# Patient Record
Sex: Female | Born: 1995
Health system: Southern US, Community
[De-identification: ages and names within clinical notes are randomized; demographics above are authoritative.]

## PROBLEM LIST (undated history)

## (undated) DIAGNOSIS — G918 Other hydrocephalus: Secondary | ICD-10-CM

## (undated) DIAGNOSIS — Z8661 Personal history of infections of the central nervous system: Secondary | ICD-10-CM

## (undated) HISTORY — DX: Personal history of infections of the central nervous system: Z86.61

## (undated) HISTORY — DX: Other hydrocephalus: G91.8

---
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
## 2016-04-12 DIAGNOSIS — H5213 Myopia, bilateral: Secondary | ICD-10-CM | POA: Diagnosis not present

## 2016-04-14 DIAGNOSIS — Z0000 Encounter for general adult medical examination without abnormal findings: Secondary | ICD-10-CM | POA: Diagnosis not present

## 2016-04-14 DIAGNOSIS — Z6831 Body mass index (BMI) 31.0-31.9, adult: Secondary | ICD-10-CM | POA: Diagnosis not present

## 2016-04-28 DIAGNOSIS — H52222 Regular astigmatism, left eye: Secondary | ICD-10-CM | POA: Diagnosis not present

## 2016-04-28 DIAGNOSIS — H4720 Unspecified optic atrophy: Secondary | ICD-10-CM | POA: Diagnosis not present

## 2016-04-28 DIAGNOSIS — H1859 Other hereditary corneal dystrophies: Secondary | ICD-10-CM | POA: Diagnosis not present

## 2016-04-28 DIAGNOSIS — H01021 Squamous blepharitis right upper eyelid: Secondary | ICD-10-CM | POA: Diagnosis not present

## 2017-04-29 DIAGNOSIS — Z0000 Encounter for general adult medical examination without abnormal findings: Secondary | ICD-10-CM | POA: Diagnosis not present

## 2017-04-29 DIAGNOSIS — Z6827 Body mass index (BMI) 27.0-27.9, adult: Secondary | ICD-10-CM | POA: Diagnosis not present

## 2017-12-14 ENCOUNTER — Ambulatory Visit (INDEPENDENT_AMBULATORY_CARE_PROVIDER_SITE_OTHER): Payer: BLUE CROSS/BLUE SHIELD

## 2017-12-14 ENCOUNTER — Ambulatory Visit (INDEPENDENT_AMBULATORY_CARE_PROVIDER_SITE_OTHER): Payer: BLUE CROSS/BLUE SHIELD | Admitting: Podiatry

## 2017-12-14 DIAGNOSIS — M2141 Flat foot [pes planus] (acquired), right foot: Secondary | ICD-10-CM

## 2017-12-14 DIAGNOSIS — Q6689 Other  specified congenital deformities of feet: Secondary | ICD-10-CM | POA: Diagnosis not present

## 2017-12-14 DIAGNOSIS — M21072 Valgus deformity, not elsewhere classified, left ankle: Secondary | ICD-10-CM

## 2017-12-14 DIAGNOSIS — M76822 Posterior tibial tendinitis, left leg: Secondary | ICD-10-CM

## 2017-12-14 DIAGNOSIS — Q666 Other congenital valgus deformities of feet: Secondary | ICD-10-CM | POA: Diagnosis not present

## 2017-12-14 DIAGNOSIS — M21071 Valgus deformity, not elsewhere classified, right ankle: Secondary | ICD-10-CM

## 2017-12-14 DIAGNOSIS — M76829 Posterior tibial tendinitis, unspecified leg: Secondary | ICD-10-CM

## 2017-12-14 DIAGNOSIS — M2142 Flat foot [pes planus] (acquired), left foot: Secondary | ICD-10-CM

## 2017-12-14 DIAGNOSIS — M76821 Posterior tibial tendinitis, right leg: Secondary | ICD-10-CM

## 2017-12-14 NOTE — Progress Notes (Signed)
  Subjective:  Patient ID: Teresa Jones, female    DOB: Oct 17, 1995,  MRN: [id number]  Chief Complaint  Patient presents with  . Foot Problem    B/L ankles turning inward and flat feet - no injury- BL feet sore all the time Tx: OTC inserts   22 y.o. female presents with the above complaint.  Reports feet are sore all the time. No past medical history on file. No current outpatient medications on file.  Not on File Review of Systems: Negative except as noted in the HPI. Denies N/V/F/Ch. Objective:  There were no vitals filed for this visit. General AA&O x3. Normal mood and affect.  Vascular Dorsalis pedis and posterior tibial pulses  present 2+ bilaterally  Capillary refill normal to all digits. Pedal hair growth normal.  Neurologic Epicritic sensation grossly present.  Dermatologic No open lesions. Interspaces clear of maceration. Nails well groomed and normal in appearance.  Orthopedic: MMT 5/5 in dorsiflexion, plantarflexion, inversion, and eversion. Collapsing pes planovalgus in stance.  Hindfoot pronation reduces on heel rise.  Hindfoot abduction   Assessment & Plan:  Patient was evaluated and treated and all questions answered.  Pes Planus Bilat with Ankle Valgus, Tendonitis -X-rays taken reviewed.  Pes planus with halo sign worse on the right concerning for possible coalition.  Discussed the etiology of the correlation.  Will trial orthotics to see if we can control the patient's pain and primary deforming force.  Should pain persist would consider MRI for possible coalition -Discussed risks and benefits of surgery should patient's pain not improve we will hold off at this time. No follow-ups on file.

## 2017-12-14 NOTE — Progress Notes (Signed)
   Subjective:    Patient ID: Teresa Jones, female    DOB: June 10, 1995, 22 y.o.   MRN: [id number]  HPI    Review of Systems  All other systems reviewed and are negative.      Objective:   Physical Exam        Assessment & Plan:

## 2017-12-16 ENCOUNTER — Other Ambulatory Visit: Payer: Self-pay | Admitting: Podiatry

## 2017-12-16 DIAGNOSIS — M21071 Valgus deformity, not elsewhere classified, right ankle: Secondary | ICD-10-CM

## 2017-12-16 DIAGNOSIS — M2142 Flat foot [pes planus] (acquired), left foot: Principal | ICD-10-CM

## 2017-12-16 DIAGNOSIS — M76822 Posterior tibial tendinitis, left leg: Secondary | ICD-10-CM

## 2017-12-16 DIAGNOSIS — M76821 Posterior tibial tendinitis, right leg: Secondary | ICD-10-CM

## 2017-12-16 DIAGNOSIS — M21072 Valgus deformity, not elsewhere classified, left ankle: Secondary | ICD-10-CM

## 2017-12-16 DIAGNOSIS — Q6689 Other  specified congenital deformities of feet: Secondary | ICD-10-CM

## 2017-12-16 DIAGNOSIS — M2141 Flat foot [pes planus] (acquired), right foot: Secondary | ICD-10-CM

## 2017-12-21 DIAGNOSIS — H1850 Unspecified hereditary corneal dystrophies: Secondary | ICD-10-CM | POA: Diagnosis not present

## 2017-12-21 DIAGNOSIS — H01021 Squamous blepharitis right upper eyelid: Secondary | ICD-10-CM | POA: Diagnosis not present

## 2017-12-21 DIAGNOSIS — H4720 Unspecified optic atrophy: Secondary | ICD-10-CM | POA: Diagnosis not present

## 2017-12-21 DIAGNOSIS — H01022 Squamous blepharitis right lower eyelid: Secondary | ICD-10-CM | POA: Diagnosis not present

## 2018-01-20 ENCOUNTER — Ambulatory Visit: Payer: BLUE CROSS/BLUE SHIELD | Admitting: *Deleted

## 2018-01-20 DIAGNOSIS — Q666 Other congenital valgus deformities of feet: Secondary | ICD-10-CM

## 2018-01-20 DIAGNOSIS — M2141 Flat foot [pes planus] (acquired), right foot: Secondary | ICD-10-CM

## 2018-01-20 DIAGNOSIS — M2142 Flat foot [pes planus] (acquired), left foot: Principal | ICD-10-CM

## 2018-01-20 DIAGNOSIS — M76829 Posterior tibial tendinitis, unspecified leg: Secondary | ICD-10-CM

## 2018-01-20 DIAGNOSIS — Q6689 Other  specified congenital deformities of feet: Secondary | ICD-10-CM

## 2018-01-20 NOTE — Progress Notes (Signed)
Patient ID: Teresa Jones, female   DOB: 05-Oct-1995, 22 y.o.   MRN: [id number]   Patient presents for orthotic pick up.  Verbal and written break in and wear instructions given.  Patient will follow up with Dr. Samuella Cota in 4 weeks if symptoms worsen or fail to improve.

## 2018-01-20 NOTE — Patient Instructions (Signed)

## 2018-06-01 ENCOUNTER — Other Ambulatory Visit: Payer: Self-pay | Admitting: Physician Assistant

## 2018-06-01 DIAGNOSIS — N6310 Unspecified lump in the right breast, unspecified quadrant: Secondary | ICD-10-CM

## 2018-06-10 ENCOUNTER — Ambulatory Visit
Admission: RE | Admit: 2018-06-10 | Discharge: 2018-06-10 | Disposition: A | Discharge status: Home / Self Care | Payer: No Typology Code available for payment source | Source: Ambulatory Visit | Attending: Medical | Admitting: Medical

## 2018-06-10 DIAGNOSIS — N6310 Unspecified lump in the right breast, unspecified quadrant: Secondary | ICD-10-CM

## 2018-10-18 DIAGNOSIS — Z6826 Body mass index (BMI) 26.0-26.9, adult: Secondary | ICD-10-CM | POA: Diagnosis not present

## 2018-10-18 DIAGNOSIS — Z01419 Encounter for gynecological examination (general) (routine) without abnormal findings: Secondary | ICD-10-CM | POA: Diagnosis not present

## 2018-10-18 DIAGNOSIS — Z23 Encounter for immunization: Secondary | ICD-10-CM | POA: Diagnosis not present

## 2018-10-18 DIAGNOSIS — Z0000 Encounter for general adult medical examination without abnormal findings: Secondary | ICD-10-CM | POA: Diagnosis not present

## 2018-11-08 DIAGNOSIS — H01021 Squamous blepharitis right upper eyelid: Secondary | ICD-10-CM | POA: Diagnosis not present

## 2018-11-08 DIAGNOSIS — H1850 Unspecified hereditary corneal dystrophies: Secondary | ICD-10-CM | POA: Diagnosis not present

## 2018-11-08 DIAGNOSIS — H52203 Unspecified astigmatism, bilateral: Secondary | ICD-10-CM | POA: Diagnosis not present

## 2018-11-08 DIAGNOSIS — H01024 Squamous blepharitis left upper eyelid: Secondary | ICD-10-CM | POA: Diagnosis not present

## 2018-11-08 DIAGNOSIS — H4720 Unspecified optic atrophy: Secondary | ICD-10-CM | POA: Diagnosis not present

## 2018-11-08 DIAGNOSIS — H01025 Squamous blepharitis left lower eyelid: Secondary | ICD-10-CM | POA: Diagnosis not present

## 2019-03-21 DIAGNOSIS — S40022A Contusion of left upper arm, initial encounter: Secondary | ICD-10-CM | POA: Diagnosis not present

## 2019-03-21 DIAGNOSIS — S7002XA Contusion of left hip, initial encounter: Secondary | ICD-10-CM | POA: Diagnosis not present

## 2019-04-28 DIAGNOSIS — Z20828 Contact with and (suspected) exposure to other viral communicable diseases: Secondary | ICD-10-CM | POA: Diagnosis not present

## 2019-10-19 NOTE — Progress Notes (Signed)
Patient cancelled

## 2019-10-20 ENCOUNTER — Other Ambulatory Visit: Payer: Self-pay

## 2019-10-20 ENCOUNTER — Ambulatory Visit (INDEPENDENT_AMBULATORY_CARE_PROVIDER_SITE_OTHER): Payer: Self-pay | Admitting: Family Medicine

## 2019-10-20 DIAGNOSIS — Z0000 Encounter for general adult medical examination without abnormal findings: Secondary | ICD-10-CM

## 2019-10-20 DIAGNOSIS — Z124 Encounter for screening for malignant neoplasm of cervix: Secondary | ICD-10-CM

## 2019-11-14 ENCOUNTER — Other Ambulatory Visit: Payer: Self-pay

## 2019-11-14 ENCOUNTER — Ambulatory Visit (INDEPENDENT_AMBULATORY_CARE_PROVIDER_SITE_OTHER): Payer: BC Managed Care – PPO | Admitting: Family Medicine

## 2019-11-14 ENCOUNTER — Encounter: Payer: Self-pay | Admitting: Family Medicine

## 2019-11-14 VITALS — BP 114/70 | HR 107 | Temp 97.8°F | Ht 59.0 in | Wt 139.2 lb

## 2019-11-14 DIAGNOSIS — Z6828 Body mass index (BMI) 28.0-28.9, adult: Secondary | ICD-10-CM | POA: Insufficient documentation

## 2019-11-14 DIAGNOSIS — Z0000 Encounter for general adult medical examination without abnormal findings: Secondary | ICD-10-CM | POA: Insufficient documentation

## 2019-11-14 NOTE — Progress Notes (Signed)
Subjective:  Patient ID: Teresa Jones, female    DOB: Sep 03, 1995  Age: 24 y.o. MRN: [id number]  Chief Complaint  Patient presents with  . Annual Exam    HPI Well Adult Physical: Patient here for a comprehensive physical exam.  Encounter for general adult medical examination without abnormal findings  Physical  Patient's last physical exam was 1 year ago .  Weight: 139 lbs, BMI 28.11 ;  Blood Pressure: Normal (BP less than 120/80) ;  Medical History: Patient history reviewed ; Family history reviewed ;  Allergies Reviewed: No change in current allergies ;  Medications Reviewed: Medications reviewed - no changes ;  Lipids: Normal lipid levels ;  Smoking: Life-long non-smoker ;  Physical Activity: Exercises inconsistently ;  Alcohol/Drug Use: Drinks occasionally; No illicit drug use ;  Patient is not afflicted from Stress Incontinence and Urge Incontinence  Safety: reviewed ; Patient wears a seat belt, has smoke detectors, has carbon monoxide detectors and wears sunscreen with extended sun exposure. Dental Care: biannual cleanings, brushes and flosses daily. Ophthalmology/Optometry: Annual visit. Wears Glasses Hearing loss: none Vision impairments: none Consumes caffeine on a daily basis but generally consumes water.  Menarche: Age 89 or 11 Menstrual History: Regular LMP: 10/29/2019 Pregnancy history: Never Safe at home: Yes Self breast exams: Yes, is unsure if she is doing them correctly  Wears glasses Foot pain-standing-needs inserts   Office Visit from 11/14/2019 in [facility]  PHQ-2 Total Score 0              Social Hx   Social History   Socioeconomic History  . Marital status: Single    Spouse name: Not on file  . Number of children: Not on file  . Years of education: Not on file  . Highest education level: Not on file  Occupational History  . Not on file  Tobacco Use  . Smoking status: Never Smoker  . Smokeless tobacco: Never Used  Substance  and Sexual Activity  . Alcohol use: Yes    Comment: Occasionally  . Drug use: Never  . Sexual activity: Never  Other Topics Concern  . Not on file  Social History Narrative  . Not on file   Social Determinants of Health   Financial Resource Strain:   . Difficulty of Paying Living Expenses:   Food Insecurity:   . Worried About Programme researcher, broadcasting/film/video in the Last Year:   . Barista in the Last Year:   Transportation Needs:   . Freight forwarder (Medical):   Marland Kitchen Lack of Transportation (Non-Medical):   Physical Activity:   . Days of Exercise per Week:   . Minutes of Exercise per Session:   Stress:   . Feeling of Stress :   Social Connections:   . Frequency of Communication with Friends and Family:   . Frequency of Social Gatherings with Friends and Family:   . Attends Religious Services:   . Active Member of Clubs or Organizations:   . Attends Banker Meetings:   Marland Kitchen Marital Status:    History reviewed. No pertinent past medical history. History reviewed. No pertinent surgical history.  History reviewed. No pertinent family history.  Review of Systems  Constitutional: Negative for chills, fatigue and fever.  HENT: Negative for congestion, ear pain, rhinorrhea and sore throat.   Eyes: Negative for discharge.       Wears glasses  Respiratory: Negative for cough and shortness of breath.   Cardiovascular: Negative  for chest pain.  Gastrointestinal: Negative for abdominal pain, constipation, diarrhea, nausea and vomiting.  Genitourinary: Negative for dysuria and urgency.  Musculoskeletal: Negative for arthralgias, back pain and myalgias.  Skin: Negative.   Neurological: Negative for dizziness, weakness, light-headedness and headaches.  Psychiatric/Behavioral: Negative for dysphoric mood. The patient is not nervous/anxious.      Objective:  BP 114/70   Pulse (!) 107   Temp 97.8 F (36.6 C)   Ht 4\' 11"  (1.499 m)   Wt 139 lb 3.2 oz (63.1 kg)   LMP  10/29/2019 (Exact Date)   SpO2 98%   BMI 28.11 kg/m   BP/Weight 2/83/1517  Systolic BP 616  Diastolic BP 70  Wt. (Lbs) 139.2  BMI 28.11    Physical Exam Vitals reviewed.  Constitutional:      Appearance: Normal appearance. She is normal weight.  HENT:     Head: Normocephalic and atraumatic.     Right Ear: Tympanic membrane and ear canal normal.     Left Ear: Tympanic membrane and ear canal normal.     Mouth/Throat:     Mouth: Mucous membranes are moist.  Eyes:     Conjunctiva/sclera: Conjunctivae normal.  Cardiovascular:     Rate and Rhythm: Normal rate and regular rhythm.     Pulses: Normal pulses.     Heart sounds: Normal heart sounds.  Pulmonary:     Effort: Pulmonary effort is normal. No respiratory distress.     Breath sounds: Normal breath sounds.  Abdominal:     General: Abdomen is flat. Bowel sounds are normal.     Palpations: Abdomen is soft.     Tenderness: There is no abdominal tenderness.  Musculoskeletal:     Cervical back: Normal range of motion and neck supple.  Neurological:     Mental Status: She is alert and oriented to person, place, and time.  Psychiatric:        Mood and Affect: Mood normal.        Behavior: Behavior normal.    Reviewed 2020 labwork   Assessment & Plan:   1. Routine medical exam Pap with HPV+ completed at screening 2020 no 16/18, pt with no cellular changes. pt has received HPV vaccines. Pt is not currently sexually active and is low risk. Repeat pap in 3 years-2023. Pt with normal periods-heavy day 1 and 2.   2. BMI 28.0-28.9,adult Suggested exercise-pt works 5 days/week -fast Therapist, occupational  Body mass index is 28.11 kg/m.   Goals   None      This is a list of the screening recommended for you and due dates:  Health Maintenance  Topic Date Due  .  Hepatitis C: One time screening is recommended by Center for Disease Control  (CDC) for  adults born from [birth year] through 1964.   Never done  . HIV Screening  Never done  .  Tetanus Vaccine  Never done  . Pap Smear  Never done  . Pap Smear  Never done  . Flu Shot  12/18/2019     AN INDIVIDUALIZED CARE PLAN: was established or reinforced today.   SELF MANAGEMENT: The patient and I together assessed ways to personally work towards obtaining the recommended goals -start exercise, fix glasses and shop for shoe inserts Support needs The patient and/or family needs were assessed -pt lives at home and works full time    Follow-up: 1 year for CPE An After Visit Summary was printed and given to the patient.  [facility] [phone]

## 2019-11-14 NOTE — Patient Instructions (Addendum)
Purchase some arch support inserts for your shoes and purchase a shoe that is made for standing. Shoe stores such as Restaurant manager, fast food ([place]), [employer] ([employer])  Next PAP due in 2023

## 2019-12-22 IMAGING — US ULTRASOUND RIGHT BREAST LIMITED
1 series · 4 of 4 positions shown · non-contrast
Comparison: None

CLINICAL DATA: 22-year-old patient recently palpated a lump in the
9-10 o'clock region of the right breast. This was also palpated by
her physician. Today, the patient nor her mother can palpate a lump
in the right breast. Family history of breast cancer in the
patient's grandmother. A diagram with the lump drawn on it is
provided by the physician.

EXAM:
ULTRASOUND OF THE RIGHT BREAST

[Series 1: ultrasound right breast limited · 0.06mm/px · 4 of 4 slices shown]
[im 1/4]
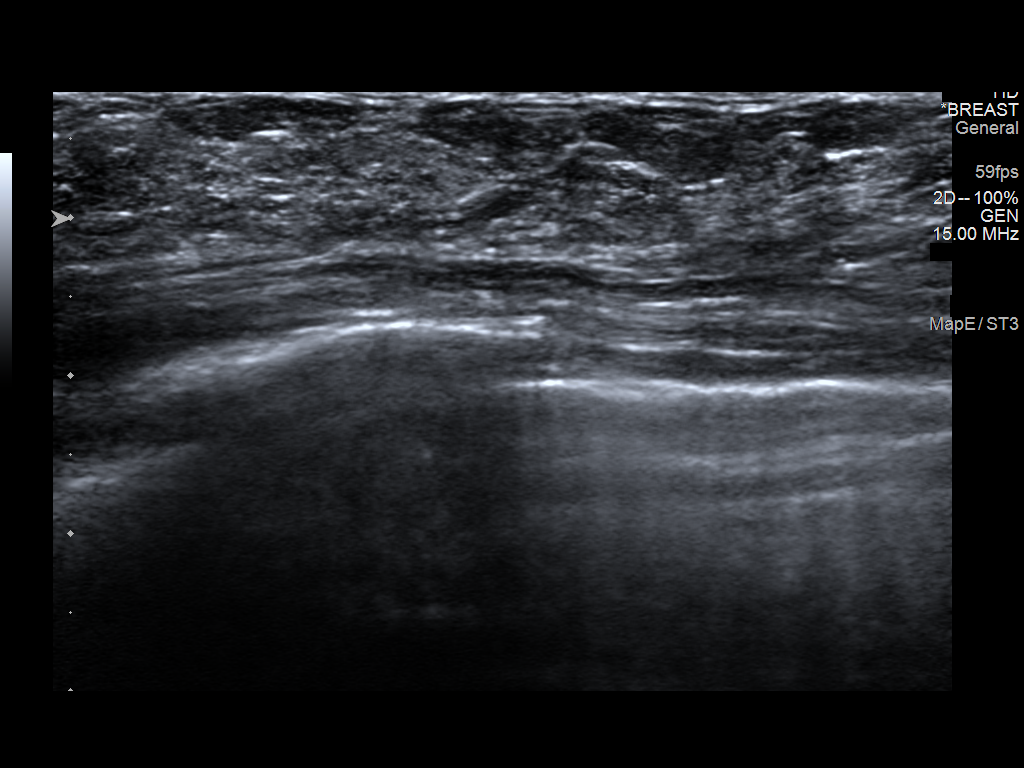
[im 2/4]
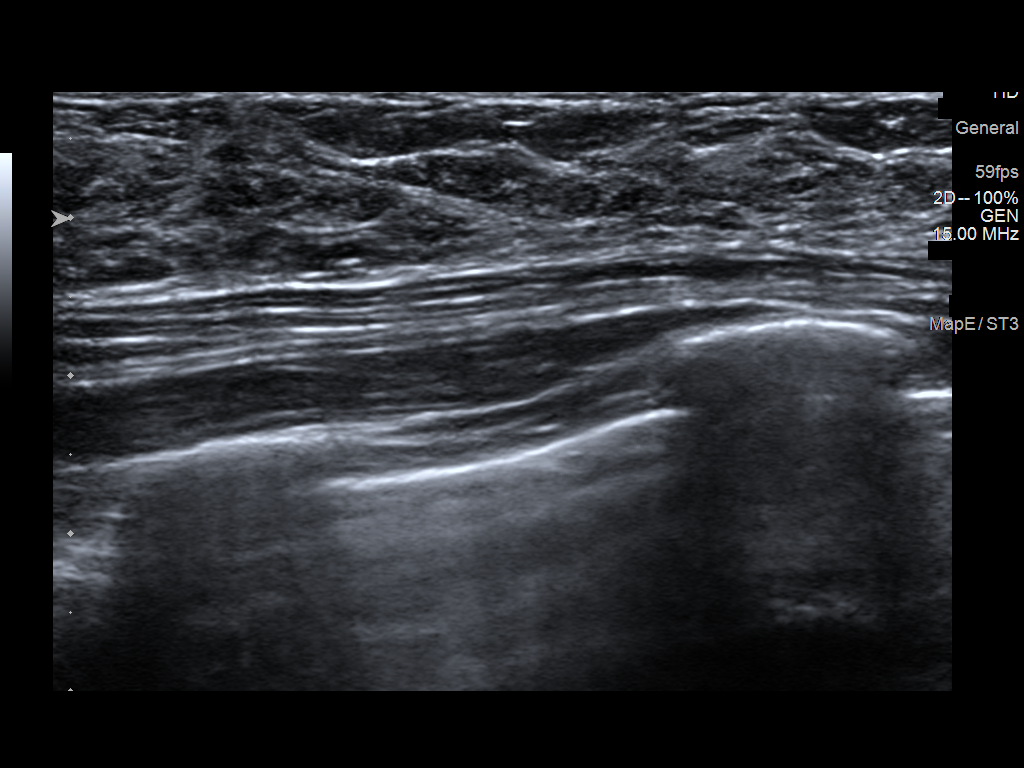
[im 3/4]
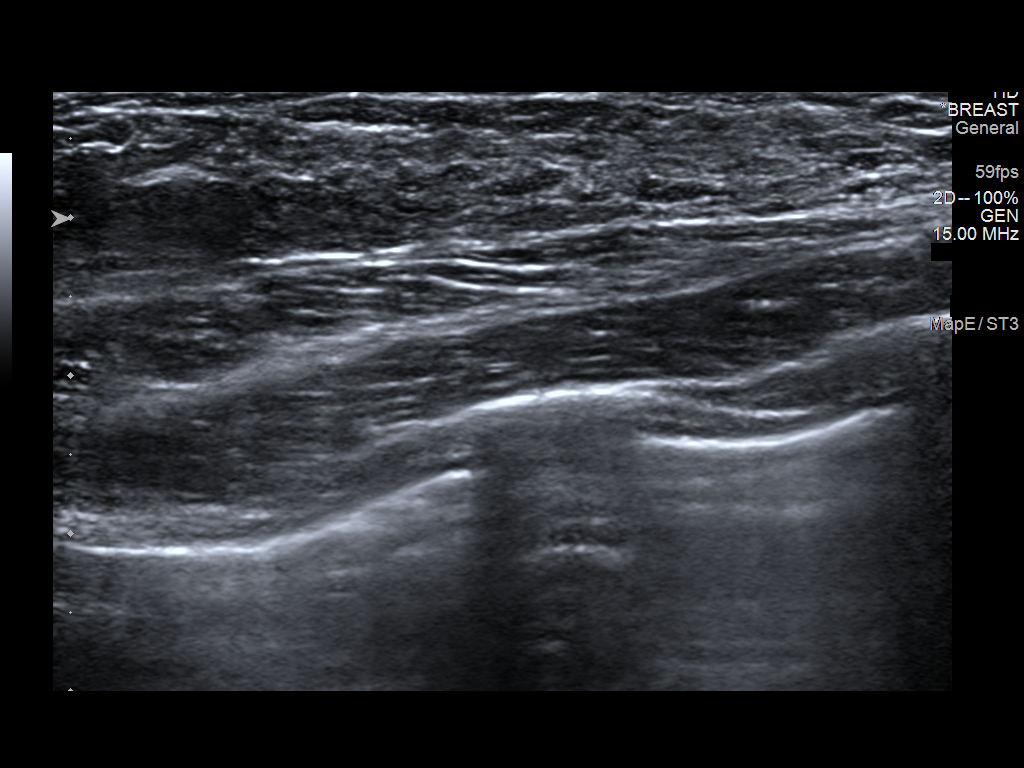
[im 4/4]
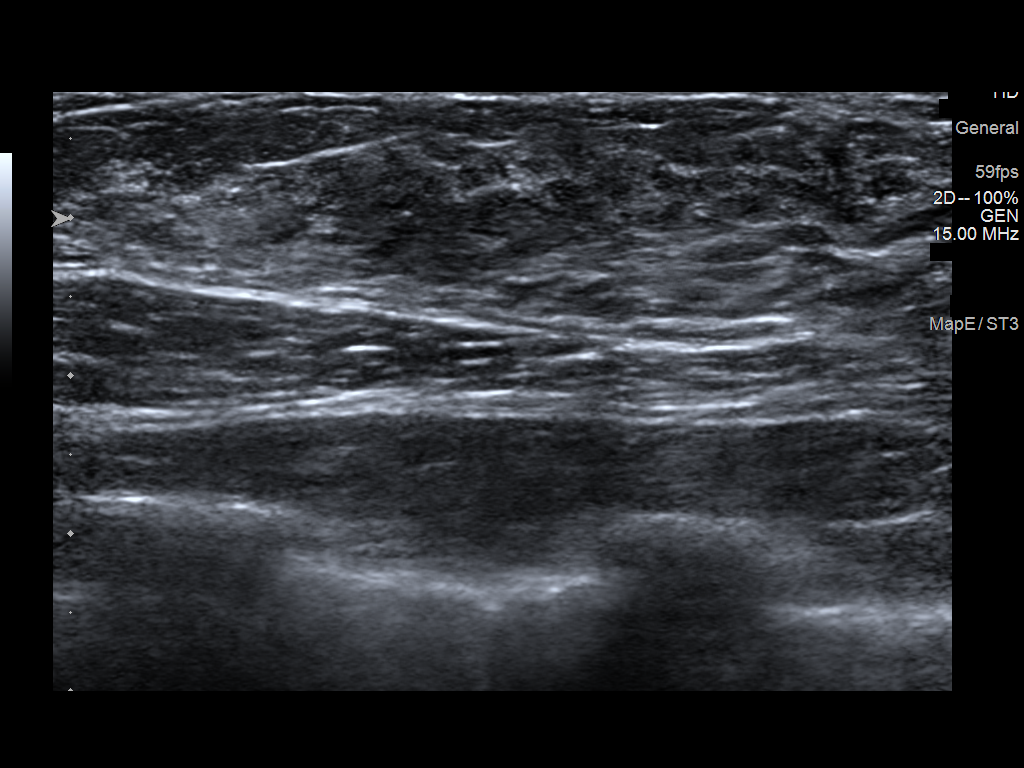

[4 of 4 positions shown; findings below may reference images not displayed]

FINDINGS: On physical exam, I do not palpate a discrete mass in the 9-10
o'clock region of the right breast today. The skin of the right
breast appears normal.

Targeted ultrasound is performed, showing normal fibroglandular
tissue in the upper-outer quadrant of the right breast. No solid or
cystic mass, abnormal shadowing, or skin thickening.
IMPRESSION: Normal sonographic appearance of the right breast in the region of
recent patient concern. Neither the patient nor I can palpate a mass
in this region today.

RECOMMENDATION:
Screening mammogram at age 40, unless new or recurrent areas of
concern arise in either breast warranting earlier evaluation.

I have discussed the findings and recommendations with the patient.
Results were also provided in writing at the conclusion of the
visit. If applicable, a reminder letter will be sent to the patient
regarding the next appointment.

BI-RADS CATEGORY  1: Negative.

## 2019-12-31 DIAGNOSIS — Z20828 Contact with and (suspected) exposure to other viral communicable diseases: Secondary | ICD-10-CM | POA: Diagnosis not present

## 2020-10-04 ENCOUNTER — Ambulatory Visit (INDEPENDENT_AMBULATORY_CARE_PROVIDER_SITE_OTHER): Payer: 59 | Admitting: Nurse Practitioner

## 2020-10-04 ENCOUNTER — Encounter: Payer: Self-pay | Admitting: Nurse Practitioner

## 2020-10-04 ENCOUNTER — Other Ambulatory Visit: Payer: Self-pay

## 2020-10-04 VITALS — BP 110/72 | HR 102 | Temp 97.6°F | Ht 59.0 in | Wt 141.8 lb

## 2020-10-04 DIAGNOSIS — Z3009 Encounter for other general counseling and advice on contraception: Secondary | ICD-10-CM

## 2020-10-04 LAB — POCT URINE PREGNANCY: Preg Test, Ur: NEGATIVE

## 2020-10-04 NOTE — Progress Notes (Signed)
Established Patient Office Visit  Subjective:  Patient ID: Teresa Jones, female    DOB: [birth year]  Age: 25 y.o. MRN: [id number]  CC:  Chief Complaint  Patient presents with  . Discuss Birth Control    HPI Teresa Jones is a 25 year old [place] American female that presents for contraception counseling. She has never been sexually active. She is scheduled to get married 12/01/20. She has a past medical history of congenital hydrocephaly. She had mother on speaker phone during counseling session to discuss various methods of contraception, risks, and benefits. Questions were answered.   Past Medical History:  Diagnosis Date  . History of meningitis   . Other hydrocephalus [facility])     Past Surgical History:  Procedure Laterality Date  . LAPAROSCOPIC REVISION VENTRICULAR-PERITONEAL (V-P) SHUNT  1998   Multiple revisions  . strabismus repair Bilateral 2000    Family History  Problem Relation Age of Onset  . Depression Mother   . Migraines Mother   . Fibromyalgia Mother     Social History   Socioeconomic History  . Marital status: Single    Spouse name: Not on file  . Number of children: Not on file  . Years of education: Not on file  . Highest education level: Not on file  Occupational History  . Not on file  Tobacco Use  . Smoking status: Never Smoker  . Smokeless tobacco: Never Used  Substance and Sexual Activity  . Alcohol use: Yes    Comment: Occasionally  . Drug use: Never  . Sexual activity: Never  Other Topics Concern  . Not on file  Social History Narrative  . Not on file    No outpatient medications prior to visit.   No facility-administered medications prior to visit.    No Known Allergies  ROS Review of Systems  Constitutional: Negative for appetite change, fatigue and unexpected weight change.  HENT: Negative for congestion, ear pain, rhinorrhea, sinus pressure, sinus pain and tinnitus.   Eyes: Negative for pain.  Respiratory: Negative  for cough and shortness of breath.   Cardiovascular: Negative for chest pain, palpitations and leg swelling.  Gastrointestinal: Negative for abdominal pain, constipation, diarrhea, nausea and vomiting.  Endocrine: Negative for cold intolerance, heat intolerance, polydipsia, polyphagia and polyuria.  Genitourinary: Negative for dysuria, frequency and hematuria.  Musculoskeletal: Negative for arthralgias, back pain, joint swelling and myalgias.  Skin: Negative for rash.  Allergic/Immunologic: Negative for environmental allergies.  Neurological: Negative for dizziness and headaches.  Hematological: Negative for adenopathy.  Psychiatric/Behavioral: Negative for decreased concentration and sleep disturbance. The patient is not nervous/anxious.       Objective:    Physical Exam Vitals reviewed.  Constitutional:      Appearance: Normal appearance.  Eyes:     Comments: Eyeglasses in place  Pulmonary:     Effort: Pulmonary effort is normal.  Skin:    General: Skin is warm and dry.     Capillary Refill: Capillary refill takes less than 2 seconds.  Neurological:     Mental Status: She is alert.  Psychiatric:        Mood and Affect: Mood normal.        Behavior: Behavior normal.     BP 110/72 (BP Location: Left Arm, Patient Position: Sitting)   Pulse (!) 102   Temp 97.6 F (36.4 C) (Temporal)   Ht 4\' 11"  (1.499 m)   Wt 141 lb 12.8 oz (64.3 kg)   SpO2 98%   BMI 28.64 kg/m  Wt Readings from Last 3 Encounters:  10/04/20 141 lb 12.8 oz (64.3 kg)  11/14/19 139 lb 3.2 oz (63.1 kg)     Health Maintenance Due  Topic Date Due  . HPV VACCINES (1 - 2-dose series) Never done  . HIV Screening  Never done  . Hepatitis C Screening  Never done  . PAP-Cervical Cytology Screening  Never done  . PAP SMEAR-Modifier  Never done  . COVID-19 Vaccine (3 - Booster for Pfizer series) 02/13/2020       Topic Date Due  . HPV VACCINES (1 - 2-dose series) Never done     Assessment & Plan:    1. Encounter for counseling regarding contraception - POCT urine pregnancy - Ambulatory referral to Obstetrics / Gynecology  After discussing risks and benefits of various forms of contraception, patient and mother have used shared-decision making to have Nexplanon insertion. Will refer pt to ob/gyn per request.      Follow-up: As needed   Signed, Janie Morning, NP

## 2020-10-04 NOTE — Patient Instructions (Addendum)
We will call you with ob/gyn appt for Nexaplanon insertion Follow-up as needed  Contraception Choices Contraception refers to things you do or use to prevent pregnancy. It is also called birth control. There are several methods of birth control. Talk to your doctor about the best method for you. Hormonal birth control This kind of birth control uses hormones. Here are some types of hormonal birth control:  A tube that is put under the skin of your arm (implant). The tube can stay in for up to 3 years.  Shots you get every 3 months.  Pills you take every day.  A patch you change 1 time each week for 3 weeks. After that, the patch is taken off for 1 week.  A ring you put in the vagina. The ring is left in for 3 weeks. Then it is taken out of the vagina for 1 week. Then a new ring is put in.  Pills you take after unprotected sex. These are called emergency birth control pills.   Barrier birth control Here are some types of barrier birth control:  A thin covering that is put on the penis before sex (female condom). The covering is thrown away after sex.  A soft, loose covering that is put in the vagina before sex (female condom). The covering is thrown away after sex.  A rubber bowl that sits over the cervix (diaphragm). The bowl must be made for you. The bowl is put into the vagina before sex. The bowl is left in for 6-8 hours after sex. It is taken out within 24 hours.  A small, soft cup that fits over the cervix (cervical cap). The cup must be made for you. The cup should be left in for 6-8 hours after sex. It is taken out within 48 hours.  A sponge that is put into the vagina before sex. It must be left in for at least 6 hours after sex. It must be taken out within 30 hours and thrown away.  A chemical that kills or stops sperm from getting into the womb (uterus). This chemical is called a spermicide. It may be a pill, cream, jelly, or foam to put in the vagina. The chemical should be  used at least 10-15 minutes before sex.   IUD birth control IUD means "intrauterine device." It is put inside the womb. There are two kinds:  Hormone IUD. This kind can stay in the womb for 3-5 years.  Copper IUD. This kind can stay in the womb for 10 years. Permanent birth control Here are some types of permanent birth control:  Surgery to block the fallopian tubes.  Having an insert put into each fallopian tube. This method takes 3 months to work. Other forms of birth control must be used for 3 months.  Surgery to tie off the tubes that carry sperm in men (vasectomy). This method takes 3 months to work. Other forms of birth control must be used for 3 months. Natural planning birth control Here are some types of natural planning birth control:  Not having sex on the days the woman could get pregnant.  Using a calendar: ? To keep track of the length of each menstrual cycle. ? To find out what days pregnancy can happen. ? To plan to not have sex on days when pregnancy can happen.  Watching for signs of ovulation and not having sex during this time. One way the woman can check for ovulation is to check her temperature.  Waiting to have sex until after ovulation. Where to find more information  Centers for Disease Control and Prevention: [employer] Summary  Contraception, also called birth control, refers to things you do or use to prevent pregnancy.  Hormonal methods of birth control include implants, injections, pills, patches, vaginal rings, and emergency birth control pills.  Barrier methods of birth control can include female condoms, female condoms, diaphragms, cervical caps, sponges, and spermicides.  There are two types of IUD (intrauterine device) birth control. An IUD can be put in a woman's womb to prevent pregnancy for several years.  Permanent birth control can be done through a procedure for males, females, or both. Natural planning means not having sex when the  woman could get pregnant. This information is not intended to replace advice given to you by your health care provider. Make sure you discuss any questions you have with your health care provider. Document Revised: 10/10/2019 Document Reviewed: 10/10/2019 Elsevier Patient Education  2021 Elsevier Inc. Etonogestrel implant What is this medicine? ETONOGESTREL (et oh noe JES trel) is a contraceptive (birth control) device. It is used to prevent pregnancy. It can be used for up to 3 years. This medicine may be used for other purposes; ask your health care provider or pharmacist if you have questions. COMMON BRAND NAME(S): Implanon, Nexplanon What should I tell my health care provider before I take this medicine? They need to know if you have any of these conditions:  abnormal vaginal bleeding  blood vessel disease or blood clots  breast, cervical, endometrial, ovarian, liver, or uterine cancer  diabetes  gallbladder disease  heart disease or recent heart attack  high blood pressure  high cholesterol or triglycerides  kidney disease  liver disease  migraine headaches  seizures  stroke  tobacco smoker  an unusual or allergic reaction to etonogestrel, anesthetics or antiseptics, other medicines, foods, dyes, or preservatives  pregnant or trying to get pregnant  breast-feeding How should I use this medicine? This device is inserted just under the skin on the inner side of your upper arm by a health care professional. Talk to your pediatrician regarding the use of this medicine in children. Special care may be needed. Overdosage: If you think you have taken too much of this medicine contact a poison control center or emergency room at once. NOTE: This medicine is only for you. Do not share this medicine with others. What if I miss a dose? This does not apply. What may interact with this medicine? Do not take this medicine with any of the following  medications:  amprenavir  fosamprenavir This medicine may also interact with the following medications:  acitretin  aprepitant  armodafinil  bexarotene  bosentan  carbamazepine  certain medicines for fungal infections like fluconazole, ketoconazole, itraconazole and voriconazole  certain medicines to treat hepatitis, HIV or AIDS  cyclosporine  felbamate  griseofulvin  lamotrigine  modafinil  oxcarbazepine  phenobarbital  phenytoin  primidone  rifabutin  rifampin  rifapentine  St. John's wort  topiramate This list may not describe all possible interactions. Give your health care provider a list of all the medicines, herbs, non-prescription drugs, or dietary supplements you use. Also tell them if you smoke, drink alcohol, or use illegal drugs. Some items may interact with your medicine. What should I watch for while using this medicine? This product does not protect you against HIV infection (AIDS) or other sexually transmitted diseases. You should be able to feel the implant by pressing your fingertips over  the skin where it was inserted. Contact your doctor if you cannot feel the implant, and use a non-hormonal birth control method (such as condoms) until your doctor confirms that the implant is in place. Contact your doctor if you think that the implant may have broken or become bent while in your arm. You will receive a user card from your health care provider after the implant is inserted. The card is a record of the location of the implant in your upper arm and when it should be removed. Keep this card with your health records. What side effects may I notice from receiving this medicine? Side effects that you should report to your doctor or health care professional as soon as possible:  allergic reactions like skin rash, itching or hives, swelling of the face, lips, or tongue  breast lumps, breast tissue changes, or discharge  breathing  problems  changes in emotions or moods  coughing up blood  if you feel that the implant may have broken or bent while in your arm  high blood pressure  pain, irritation, swelling, or bruising at the insertion site  scar at site of insertion  signs of infection at the insertion site such as fever, and skin redness, pain or discharge  signs and symptoms of a blood clot such as breathing problems; changes in vision; chest pain; severe, sudden headache; pain, swelling, warmth in the leg; trouble speaking; sudden numbness or weakness of the face, arm or leg  signs and symptoms of liver injury like dark yellow or brown urine; general ill feeling or flu-like symptoms; light-colored stools; loss of appetite; nausea; right upper belly pain; unusually weak or tired; yellowing of the eyes or skin  unusual vaginal bleeding, discharge Side effects that usually do not require medical attention (report to your doctor or health care professional if they continue or are bothersome):  acne  breast pain or tenderness  headache  irregular menstrual bleeding  nausea This list may not describe all possible side effects. Call your doctor for medical advice about side effects. You may report side effects to FDA at 1-800-FDA-1088. Where should I keep my medicine? This drug is given in a hospital or clinic and will not be stored at home. NOTE: This sheet is a summary. It may not cover all possible information. If you have questions about this medicine, talk to your doctor, pharmacist, or health care provider.  2021 Elsevier/Gold Standard (2019-02-15 11:33:04)

## 2020-10-12 ENCOUNTER — Ambulatory Visit: Payer: Self-pay | Admitting: Nurse Practitioner

## 2020-10-24 DIAGNOSIS — Z30019 Encounter for initial prescription of contraceptives, unspecified: Secondary | ICD-10-CM | POA: Diagnosis not present

## 2020-10-25 ENCOUNTER — Telehealth: Payer: Self-pay

## 2020-10-25 DIAGNOSIS — Z982 Presence of cerebrospinal fluid drainage device: Secondary | ICD-10-CM | POA: Diagnosis not present

## 2020-10-25 DIAGNOSIS — R930 Abnormal findings on diagnostic imaging of skull and head, not elsewhere classified: Secondary | ICD-10-CM | POA: Diagnosis not present

## 2020-10-25 DIAGNOSIS — G918 Other hydrocephalus: Secondary | ICD-10-CM | POA: Diagnosis not present

## 2020-10-25 DIAGNOSIS — R55 Syncope and collapse: Secondary | ICD-10-CM | POA: Diagnosis not present

## 2020-10-25 DIAGNOSIS — R42 Dizziness and giddiness: Secondary | ICD-10-CM | POA: Diagnosis not present

## 2020-10-25 DIAGNOSIS — R110 Nausea: Secondary | ICD-10-CM | POA: Diagnosis not present

## 2020-10-25 NOTE — Telephone Encounter (Signed)
Mrs. Llamas Mother called to report that she was called to pick-up her daughter at work,  Kentley is experiencing dizziness and light-headedness but no headache.  She has had no seizure.  Dr. Sedalia Muta advised that she go to the ED for evaluation and treatment.  The Mother plans to carry her now.

## 2020-11-15 DIAGNOSIS — Z30017 Encounter for initial prescription of implantable subdermal contraceptive: Secondary | ICD-10-CM | POA: Diagnosis not present

## 2020-11-15 DIAGNOSIS — Z3202 Encounter for pregnancy test, result negative: Secondary | ICD-10-CM | POA: Diagnosis not present

## 2020-12-18 NOTE — Progress Notes (Signed)
Subjective:  Patient ID: Teresa Jones, female    DOB: 11-26-1995  Age: 25 y.o. MRN: [id number]  CC: Physical exam  HPI Well Adult Physical: Patient here for a comprehensive physical exam.The patient reports no problems Do you take any herbs or supplements that were not prescribed by a doctor? no Are you taking calcium supplements? no Are you taking aspirin daily? no  Encounter for general adult medical examination without abnormal findings  Physical ("At Risk" items are starred): Patient's last physical exam was 1 year ago .  Smoking: Life-long non-smoker ;  Physical Activity: Exercises at least 3 times per week ;  Alcohol/Drug Use: Is a non-drinker ; No illicit drug use ;  Patient is not afflicted from Stress Incontinence and Urge Incontinence  Safety: reviewed. Patient wears a seat belt, has smoke detectors, has carbon monoxide detectors, practices appropriate gun safety, and wears sunscreen with extended sun exposure. Dental Care: biannual cleanings, brushes and flosses daily. Ophthalmology/Optometry: Due for eye exam Hearing loss: none Vision impairments: wears prescription glasses  Menarche: 25 years old Menstrual History: regular LMP: 11/09/20 Pregnancy history: none Safe at home: yes Self breast exams: yes .    Flowsheet Row Office Visit from 11/14/2019 in [facility]  PHQ-2 Total Score 0         Social Hx   Social History   Socioeconomic History   Marital status: Married    Spouse name: Not on file   Number of children: Not on file   Years of education: Not on file   Highest education level: Not on file  Occupational History   Not on file  Tobacco Use   Smoking status: Never   Smokeless tobacco: Never  Substance and Sexual Activity   Alcohol use: Yes    Comment: Occasionally   Drug use: Never   Sexual activity: yes  Other Topics Concern   Not on file  Social History Narrative   Not on file   Social Determinants of Health    Financial Resource Strain: Not on file  Food Insecurity: Not on file  Transportation Needs: Not on file  Physical Activity: Not on file  Stress: Not on file  Social Connections: Not on file   Past Medical History:  Diagnosis Date   History of meningitis    Other hydrocephalus (HCC)    Past Surgical History:  Procedure Laterality Date   LAPAROSCOPIC REVISION VENTRICULAR-PERITONEAL (V-P) SHUNT  1998   Multiple revisions   strabismus repair Bilateral 2000    Family History  Problem Relation Age of Onset   Depression Mother    Migraines Mother    Fibromyalgia Mother     Review of Systems  Constitutional:  Negative for appetite change, fatigue and fever.  HENT:  Negative for congestion, ear pain, sinus pressure and sore throat.   Eyes:  Negative for pain.  Respiratory:  Negative for cough, chest tightness, shortness of breath and wheezing.   Cardiovascular:  Negative for chest pain and palpitations.  Gastrointestinal:  Negative for abdominal pain, constipation, diarrhea, nausea and vomiting.  Genitourinary:  Negative for dysuria and hematuria.  Musculoskeletal:  Negative for arthralgias, back pain, joint swelling and myalgias.  Skin:  Negative for rash.  Neurological:  Negative for dizziness, weakness and headaches.  Psychiatric/Behavioral:  Negative for dysphoric mood. The patient is not nervous/anxious.     Objective:  BP 102/68 (BP Location: Left Arm, Patient Position: Sitting)   Pulse (!) 103   Temp (!) 97.5 F (  36.4 C) (Temporal)   Ht 4\' 11"  (1.499 m)   Wt 144 lb (65.3 kg)   SpO2 99%   BMI 29.08 kg/m    BP/Weight 10/04/2020 11/14/2019  Systolic BP 110 114  Diastolic BP 72 70  Wt. (Lbs) 141.8 139.2  BMI 28.64 28.11    Physical Exam Vitals reviewed.  Constitutional:      Appearance: Normal appearance. She is normal weight.  HENT:     Right Ear: Tympanic membrane, ear canal and external ear normal.     Left Ear: Tympanic membrane, ear canal and external  ear normal.     Nose: Nose normal.     Mouth/Throat:     Mouth: Mucous membranes are moist.  Cardiovascular:     Rate and Rhythm: Normal rate and regular rhythm.     Pulses: Normal pulses.     Heart sounds: Normal heart sounds.  Pulmonary:     Effort: Pulmonary effort is normal.     Breath sounds: Normal breath sounds.  Abdominal:     Palpations: Abdomen is soft.  Musculoskeletal:        General: Normal range of motion.     Cervical back: Normal range of motion.  Skin:    General: Skin is warm and dry.  Neurological:     Mental Status: She is alert and oriented to person, place, and time.  Psychiatric:        Mood and Affect: Mood normal.        Behavior: Behavior normal.        Thought Content: Thought content normal.        Judgment: Judgment normal.        Assessment & Plan:   1. Encounter for general adult medical examination without abnormal findings  2. BMI 29.0-29.9,adult  -heart healthy diet  -increase physical activity      These are the goals we discussed:  Goals   Enjoy new marriage and job       This is a list of the screening recommended for you and due dates:  Health Maintenance  Topic Date Due   HPV Vaccine (1 - 2-dose series) Never done   HIV Screening  Never done   Hepatitis C Screening: USPSTF Recommendation to screen - Ages 5-79 yo.  Never done   Pap Smear  Never done   Pap Smear  Never done   COVID-19 Vaccine (3 - Booster for Pfizer series) 02/13/2020   Flu Shot  12/17/2020   Tetanus Vaccine  10/17/2028   Pneumococcal Vaccination  Aged Out     AN INDIVIDUALIZED CARE PLAN: was established or reinforced today.   SELF MANAGEMENT: The patient and I together assessed ways to personally work towards obtaining the recommended goals  Support needs The patient and/or family needs were assessed and services were offered if appropriate.    Follow-up: 1-year  An After Visit Summary was printed and given to the patient.    I,Lauren M  Auman,acting as a 12/17/2028 for Neurosurgeon, NP.,have documented all relevant documentation on the behalf of [employer], NP,as directed by  Janie Morning, NP while in the presence of Janie Morning, NP.    I, Janie Morning, NP, have reviewed all documentation for this visit. The documentation on 12/19/20 for the exam, diagnosis, procedures, and orders are all accurate and complete.    02/18/21, NP [facility] [phone]

## 2020-12-19 ENCOUNTER — Encounter: Payer: Self-pay | Admitting: Nurse Practitioner

## 2020-12-19 ENCOUNTER — Ambulatory Visit (INDEPENDENT_AMBULATORY_CARE_PROVIDER_SITE_OTHER): Payer: 59 | Admitting: Nurse Practitioner

## 2020-12-19 ENCOUNTER — Other Ambulatory Visit: Payer: Self-pay

## 2020-12-19 VITALS — BP 102/68 | HR 103 | Temp 97.5°F | Ht 59.0 in | Wt 144.0 lb

## 2020-12-19 DIAGNOSIS — Z0000 Encounter for general adult medical examination without abnormal findings: Secondary | ICD-10-CM

## 2020-12-19 DIAGNOSIS — Z6829 Body mass index (BMI) 29.0-29.9, adult: Secondary | ICD-10-CM | POA: Diagnosis not present

## 2020-12-19 NOTE — Patient Instructions (Addendum)
Heart healthy diet Begin multi-vitamins Recommend routine eye exam Use over-the-counter Debrox ear wax drops to remove excess wax from ears Follow-up in 1-year or sooner if needed  Earwax Buildup, Adult The ears produce a substance called earwax that helps keep bacteria out of the ear and protects the skin in the ear canal. Occasionally, earwax can build upin the ear and cause discomfort or hearing loss. What are the causes? This condition is caused by a buildup of earwax. Ear canals are self-cleaning. Ear wax is made in the outer part of the ear canal and generally falls out insmall amounts over time. When the self-cleaning mechanism is not working, earwax builds up and can cause decreased hearing and discomfort. Attempting to clean ears with cotton swabs can push the earwax deep into the ear canal and cause decreased hearing andpain. What increases the risk? This condition is more likely to develop in people who: Clean their ears often with cotton swabs. Pick at their ears. Use earplugs or in-ear headphones often, or wear hearing aids. The following factors may also make you more likely to develop this condition: Being female. Being of older age. Naturally producing more earwax. Having narrow ear canals. Having earwax that is overly thick or sticky. Having excess hair in the ear canal. Having eczema. Being dehydrated. What are the signs or symptoms? Symptoms of this condition include: Reduced or muffled hearing. A feeling of fullness in the ear or feeling that the ear is plugged. Fluid coming from the ear. Ear pain or an itchy ear. Ringing in the ear. Coughing. Balance problems. An obvious piece of earwax that can be seen inside the ear canal. How is this diagnosed? This condition may be diagnosed based on: Your symptoms. Your medical history. An ear exam. During the exam, your health care provider will look into your ear with an instrument called an otoscope. You may have  tests, including a hearing test. How is this treated? This condition may be treated by: Using ear drops to soften the earwax. Having the earwax removed by a health care provider. The health care provider may: Flush the ear with water. Use an instrument that has a loop on the end (curette). Use a suction device. Having surgery to remove the wax buildup. This may be done in severe cases. Follow these instructions at home:  Take over-the-counter and prescription medicines only as told by your health care provider. Do not put any objects, including cotton swabs, into your ear. You can clean the opening of your ear canal with a washcloth or facial tissue. Follow instructions from your health care provider about cleaning your ears. Do not overclean your ears. Drink enough fluid to keep your urine pale yellow. This will help to thin the earwax. Keep all follow-up visits as told. If earwax builds up in your ears often or if you use hearing aids, consider seeing your health care provider for routine, preventive ear cleanings. Ask your health care provider how often you should schedule your cleanings. If you have hearing aids, clean them according to instructions from the manufacturer and your health care provider. Contact a health care provider if: You have ear pain. You develop a fever. You have pus or other fluid coming from your ear. You have hearing loss. You have ringing in your ears that does not go away. You feel like the room is spinning (vertigo). Your symptoms do not improve with treatment. Get help right away if: You have bleeding from the affected ear. You  have severe ear pain. Summary Earwax can build up in the ear and cause discomfort or hearing loss. The most common symptoms of this condition include reduced or muffled hearing, a feeling of fullness in the ear, or feeling that the ear is plugged. This condition may be diagnosed based on your symptoms, your medical history, and an  ear exam. This condition may be treated by using ear drops to soften the earwax or by having the earwax removed by a health care provider. Do not put any objects, including cotton swabs, into your ear. You can clean the opening of your ear canal with a washcloth or facial tissue. This information is not intended to replace advice given to you by your health care provider. Make sure you discuss any questions you have with your healthcare provider. Document Revised: 08/23/2019 Document Reviewed: 08/23/2019 Elsevier Patient Education  [place] 10-66 Years Old, Female Preventive care refers to lifestyle choices and visits with your health care provider that can promote health and wellness. This includes: A yearly physical exam. This is also called an annual wellness visit. Regular dental and eye exams. Immunizations. Screening for certain conditions. Healthy lifestyle choices, such as: Eating a healthy diet. Getting regular exercise. Not using drugs or products that contain nicotine and tobacco. Limiting alcohol use. What can I expect for my preventive care visit? Physical exam Your health care provider may check your: Height and weight. These may be used to calculate your BMI (body mass index). BMI is a measurement that tells if you are at a healthy weight. Heart rate and blood pressure. Body temperature. Skin for abnormal spots. Counseling Your health care provider may ask you questions about your: Past medical problems. Family's medical history. Alcohol, tobacco, and drug use. Emotional well-being. Home life and relationship well-being. Sexual activity. Diet, exercise, and sleep habits. Work and work Statistician. Access to firearms. Method of birth control. Menstrual cycle. Pregnancy history. What immunizations do I need?  Vaccines are usually given at various ages, according to a schedule. Your health care provider will recommend vaccines for you  based on your age, medicalhistory, and lifestyle or other factors, such as travel or where you work. What tests do I need?  Blood tests Lipid and cholesterol levels. These may be checked every 5 years starting at age 56. Hepatitis C test. Hepatitis B test. Screening Diabetes screening. This is done by checking your blood sugar (glucose) after you have not eaten for a while (fasting). STD (sexually transmitted disease) testing, if you are at risk. BRCA-related cancer screening. This may be done if you have a family history of breast, ovarian, tubal, or peritoneal cancers. Pelvic exam and Pap test. This may be done every 3 years starting at age 97. Starting at age 74, this may be done every 5 years if you have a Pap test in combination with an HPV test. Talk with your health care provider about your test results, treatment options,and if necessary, the need for more tests. Follow these instructions at home: Eating and drinking  Eat a healthy diet that includes fresh fruits and vegetables, whole grains, lean protein, and low-fat dairy products. Take vitamin and mineral supplements as recommended by your health care provider. Do not drink alcohol if: Your health care provider tells you not to drink. You are pregnant, may be pregnant, or are planning to become pregnant. If you drink alcohol: Limit how much you have to 0-1 drink a day. Be aware of how  much alcohol is in your drink. In the [place] one drink equals one 12 oz bottle of beer (355 mL), one 5 oz glass of wine (148 mL), or one 1 oz glass of hard liquor (44 mL).  Lifestyle Take daily care of your teeth and gums. Brush your teeth every morning and night with fluoride toothpaste. Floss one time each day. Stay active. Exercise for at least 30 minutes 5 or more days each week. Do not use any products that contain nicotine or tobacco, such as cigarettes, e-cigarettes, and chewing tobacco. If you need help quitting, ask your health care  provider. Do not use drugs. If you are sexually active, practice safe sex. Use a condom or other form of protection to prevent STIs (sexually transmitted infections). If you do not wish to become pregnant, use a form of birth control. If you plan to become pregnant, see your health care provider for a prepregnancy visit. Find healthy ways to cope with stress, such as: Meditation, yoga, or listening to music. Journaling. Talking to a trusted person. Spending time with friends and family. Safety Always wear your seat belt while driving or riding in a vehicle. Do not drive: If you have been drinking alcohol. Do not ride with someone who has been drinking. When you are tired or distracted. While texting. Wear a helmet and other protective equipment during sports activities. If you have firearms in your house, make sure you follow all gun safety procedures. Seek help if you have been physically or sexually abused. What's next? Go to your health care provider once a year for an annual wellness visit. Ask your health care provider how often you should have your eyes and teeth checked. Stay up to date on all vaccines. This information is not intended to replace advice given to you by your health care provider. Make sure you discuss any questions you have with your healthcare provider. Document Revised: 01/01/2020 Document Reviewed: 01/14/2018 Elsevier Patient Education  [place] Maintenance, Female Adopting a healthy lifestyle and getting preventive care are important in promoting health and wellness. Ask your health care provider about: The right schedule for you to have regular tests and exams. Things you can do on your own to prevent diseases and keep yourself healthy. What should I know about diet, weight, and exercise? Eat a healthy diet  Eat a diet that includes plenty of vegetables, fruits, low-fat dairy products, and lean protein. Do not eat a lot of foods that are  high in solid fats, added sugars, or sodium.  Maintain a healthy weight Body mass index (BMI) is used to identify weight problems. It estimates body fat based on height and weight. Your health care provider can help determineyour BMI and help you achieve or maintain a healthy weight. Get regular exercise Get regular exercise. This is one of the most important things you can do for your health. Most adults should: Exercise for at least 150 minutes each week. The exercise should increase your heart rate and make you sweat (moderate-intensity exercise). Do strengthening exercises at least twice a week. This is in addition to the moderate-intensity exercise. Spend less time sitting. Even light physical activity can be beneficial. Watch cholesterol and blood lipids Have your blood tested for lipids and cholesterol at 24 years of age, then havethis test every 5 years. Have your cholesterol levels checked more often if: Your lipid or cholesterol levels are high. You are older than 24 years of age. You are at high risk  for heart disease. What should I know about cancer screening? Depending on your health history and family history, you may need to have cancer screening at various ages. This may include screening for: Breast cancer. Cervical cancer. Colorectal cancer. Skin cancer. Lung cancer. What should I know about heart disease, diabetes, and high blood pressure? Blood pressure and heart disease High blood pressure causes heart disease and increases the risk of stroke. This is more likely to develop in people who have high blood pressure readings, are of African descent, or are overweight. Have your blood pressure checked: Every 3-5 years if you are 31-80 years of age. Every year if you are 85 years old or older. Diabetes Have regular diabetes screenings. This checks your fasting blood sugar level. Have the screening done: Once every three years after age 36 if you are at a normal weight and  have a low risk for diabetes. More often and at a younger age if you are overweight or have a high risk for diabetes. What should I know about preventing infection? Hepatitis B If you have a higher risk for hepatitis B, you should be screened for this virus. Talk with your health care provider to find out if you are at risk forhepatitis B infection. Hepatitis C Testing is recommended for: Everyone born from [birth year] through 1964. Anyone with known risk factors for hepatitis C. Sexually transmitted infections (STIs) Get screened for STIs, including gonorrhea and chlamydia, if: You are sexually active and are younger than 24 years of age. You are older than 24 years of age and your health care provider tells you that you are at risk for this type of infection. Your sexual activity has changed since you were last screened, and you are at increased risk for chlamydia or gonorrhea. Ask your health care provider if you are at risk. Ask your health care provider about whether you are at high risk for HIV. Your health care provider may recommend a prescription medicine to help prevent HIV infection. If you choose to take medicine to prevent HIV, you should first get tested for HIV. You should then be tested every 3 months for as long as you are taking the medicine. Pregnancy If you are about to stop having your period (premenopausal) and you may become pregnant, seek counseling before you get pregnant. Take 400 to 800 micrograms (mcg) of folic acid every day if you become pregnant. Ask for birth control (contraception) if you want to prevent pregnancy. Osteoporosis and menopause Osteoporosis is a disease in which the bones lose minerals and strength with aging. This can result in bone fractures. If you are 38 years old or older, or if you are at risk for osteoporosis and fractures, ask your health care provider if you should: Be screened for bone loss. Take a calcium or vitamin D supplement to lower your  risk of fractures. Be given hormone replacement therapy (HRT) to treat symptoms of menopause. Follow these instructions at home: Lifestyle Do not use any products that contain nicotine or tobacco, such as cigarettes, e-cigarettes, and chewing tobacco. If you need help quitting, ask your health care provider. Do not use street drugs. Do not share needles. Ask your health care provider for help if you need support or information about quitting drugs. Alcohol use Do not drink alcohol if: Your health care provider tells you not to drink. You are pregnant, may be pregnant, or are planning to become pregnant. If you drink alcohol: Limit how much you use to  0-1 drink a day. Limit intake if you are breastfeeding. Be aware of how much alcohol is in your drink. In the [place] one drink equals one 12 oz bottle of beer (355 mL), one 5 oz glass of wine (148 mL), or one 1 oz glass of hard liquor (44 mL). General instructions Schedule regular health, dental, and eye exams. Stay current with your vaccines. Tell your health care provider if: You often feel depressed. You have ever been abused or do not feel safe at home. Summary Adopting a healthy lifestyle and getting preventive care are important in promoting health and wellness. Follow your health care provider's instructions about healthy diet, exercising, and getting tested or screened for diseases. Follow your health care provider's instructions on monitoring your cholesterol and blood pressure. This information is not intended to replace advice given to you by your health care provider. Make sure you discuss any questions you have with your healthcare provider. Document Revised: 04/28/2018 Document Reviewed: 04/28/2018 Elsevier Patient Education  2022 [employer].

## 2021-02-01 DIAGNOSIS — N6452 Nipple discharge: Secondary | ICD-10-CM | POA: Diagnosis not present

## 2021-02-25 DIAGNOSIS — N6452 Nipple discharge: Secondary | ICD-10-CM | POA: Diagnosis not present

## 2021-03-04 ENCOUNTER — Telehealth: Payer: Self-pay

## 2021-03-04 NOTE — Telephone Encounter (Signed)
Patient called stating that she was requesting if she could have an antibiotic called in to the pharmacy before her dentist appointment on Friday. She stated that she has to take it due to having a schunt. She is wanting it sent to her pharmacy in [place] which is [facility] [id number] [place].

## 2021-03-05 ENCOUNTER — Other Ambulatory Visit: Payer: Self-pay | Admitting: Family Medicine

## 2021-03-06 ENCOUNTER — Other Ambulatory Visit: Payer: Self-pay | Admitting: Family Medicine

## 2021-03-06 MED ORDER — AMOXICILLIN 500 MG PO TABS: ORAL_TABLET | ORAL | 0 refills | Status: DC

## 2021-03-21 MED ORDER — AMOXICILLIN 500 MG PO CAPS: 500.0000 mg | ORAL_CAPSULE | Freq: Three times a day (TID) | ORAL | 0 refills | Status: DC

## 2021-03-22 ENCOUNTER — Other Ambulatory Visit: Payer: Self-pay | Admitting: Family Medicine

## 2021-03-22 MED ORDER — AMOXICILLIN 500 MG PO TABS: ORAL_TABLET | ORAL | 2 refills | Status: AC

## 2021-03-22 NOTE — Telephone Encounter (Signed)
Patient stated she usually only takes four amoxil before going to dentist and wanted to know if we could cancel the order for 30 and just send in 4, because she don't need 30.

## 2021-05-03 DIAGNOSIS — H53002 Unspecified amblyopia, left eye: Secondary | ICD-10-CM | POA: Diagnosis not present

## 2021-05-03 DIAGNOSIS — H5213 Myopia, bilateral: Secondary | ICD-10-CM | POA: Diagnosis not present

## 2021-05-03 DIAGNOSIS — H52203 Unspecified astigmatism, bilateral: Secondary | ICD-10-CM | POA: Diagnosis not present

## 2021-05-03 DIAGNOSIS — H509 Unspecified strabismus: Secondary | ICD-10-CM | POA: Diagnosis not present

## 2021-05-03 DIAGNOSIS — H18592 Other hereditary corneal dystrophies, left eye: Secondary | ICD-10-CM | POA: Diagnosis not present

## 2021-05-16 DIAGNOSIS — R5383 Other fatigue: Secondary | ICD-10-CM | POA: Diagnosis not present

## 2021-05-16 DIAGNOSIS — Z6830 Body mass index (BMI) 30.0-30.9, adult: Secondary | ICD-10-CM | POA: Diagnosis not present

## 2021-05-16 DIAGNOSIS — Z1159 Encounter for screening for other viral diseases: Secondary | ICD-10-CM | POA: Diagnosis not present
